# Patient Record
Sex: Male | Born: 1955 | Race: White | Hispanic: No | Marital: Married | Smoking: Former smoker
Health system: Southern US, Community
[De-identification: ages and names within clinical notes are randomized; demographics above are authoritative.]

## PROBLEM LIST (undated history)

## (undated) DIAGNOSIS — I2510 Atherosclerotic heart disease of native coronary artery without angina pectoris: Secondary | ICD-10-CM

## (undated) DIAGNOSIS — E7800 Pure hypercholesterolemia, unspecified: Secondary | ICD-10-CM

## (undated) DIAGNOSIS — G4730 Sleep apnea, unspecified: Secondary | ICD-10-CM

## (undated) DIAGNOSIS — G629 Polyneuropathy, unspecified: Secondary | ICD-10-CM

## (undated) DIAGNOSIS — I219 Acute myocardial infarction, unspecified: Secondary | ICD-10-CM

## (undated) DIAGNOSIS — I10 Essential (primary) hypertension: Secondary | ICD-10-CM

## (undated) DIAGNOSIS — M1990 Unspecified osteoarthritis, unspecified site: Secondary | ICD-10-CM

## (undated) DIAGNOSIS — R7303 Prediabetes: Secondary | ICD-10-CM

## (undated) HISTORY — PX: COLONOSCOPY: SHX174

## (undated) HISTORY — PX: TONSILLECTOMY: SUR1361

## (undated) HISTORY — PX: TOE AMPUTATION: SHX809

## (undated) HISTORY — PX: SHOULDER ARTHROSCOPY WITH OPEN ROTATOR CUFF REPAIR: SHX6092

## (undated) HISTORY — PX: VASECTOMY: SHX75

## (undated) HISTORY — PX: NASAL SINUS SURGERY: SHX719

## (undated) HISTORY — PX: SHOULDER ARTHROSCOPY W/ ROTATOR CUFF REPAIR: SHX2400

---

## 2000-11-05 HISTORY — PX: CARDIAC CATHETERIZATION: SHX172

## 2019-11-06 HISTORY — PX: SHOULDER ARTHROSCOPY W/ ROTATOR CUFF REPAIR: SHX2400

## 2020-08-01 ENCOUNTER — Other Ambulatory Visit: Payer: Self-pay | Admitting: Neurological Surgery

## 2020-08-29 ENCOUNTER — Ambulatory Visit: Admit: 2020-08-29 | Payer: Self-pay | Admitting: Neurological Surgery

## 2020-08-29 SURGERY — POSTERIOR LUMBAR FUSION 2 LEVEL
Anesthesia: General | Site: Back

## 2021-01-16 ENCOUNTER — Other Ambulatory Visit: Payer: Self-pay | Admitting: Neurological Surgery

## 2021-01-16 DIAGNOSIS — M4310 Spondylolisthesis, site unspecified: Secondary | ICD-10-CM

## 2021-02-03 ENCOUNTER — Other Ambulatory Visit: Payer: Self-pay

## 2021-02-03 ENCOUNTER — Encounter (HOSPITAL_COMMUNITY): Payer: Self-pay | Admitting: Neurological Surgery

## 2021-02-03 ENCOUNTER — Other Ambulatory Visit (HOSPITAL_COMMUNITY)
Admission: RE | Admit: 2021-02-03 | Discharge: 2021-02-03 | Disposition: A | Discharge status: Home / Self Care | Payer: BC Managed Care – PPO | Source: Ambulatory Visit | Attending: Neurological Surgery | Admitting: Neurological Surgery

## 2021-02-03 DIAGNOSIS — Z20822 Contact with and (suspected) exposure to covid-19: Secondary | ICD-10-CM | POA: Diagnosis not present

## 2021-02-03 DIAGNOSIS — Z01812 Encounter for preprocedural laboratory examination: Secondary | ICD-10-CM | POA: Insufficient documentation

## 2021-02-03 NOTE — Anesthesia Preprocedure Evaluation (Addendum)
Anesthesia Evaluation  Patient identified by MRN, date of birth, ID band Patient awake    Reviewed: Allergy & Precautions, NPO status , Patient's Chart, lab work & pertinent test results  History of Anesthesia Complications Negative for: history of anesthetic complications  Airway Mallampati: III  TM Distance: >3 FB Neck ROM: Full    Dental  (+) Missing   Pulmonary sleep apnea and Continuous Positive Airway Pressure Ventilation , former smoker,    Pulmonary exam normal        Cardiovascular hypertension, + CAD, + Past MI and + Cardiac Stents (2002)  Normal cardiovascular exam     Neuro/Psych negative neurological ROS     GI/Hepatic negative GI ROS, Neg liver ROS,   Endo/Other  diabetes (pre-DM)  Renal/GU negative Renal ROS  negative genitourinary   Musculoskeletal  (+) Arthritis ,   Abdominal   Peds  Hematology negative hematology ROS (+)   Anesthesia Other Findings  Nuclear stress test 12/02/17 (care everywhere):  1. Normal myocardial perfusion scan.  2. Normal left ventricular function.  3. Left ventricular ejection fraction is 70%.  4. Stress to rest volume ratio (TID) is 0.93 for the left ventricle (normal).  5. Normal Lexiscan stress EKG.  6. When compared to results from 05/11/2011, this study is normal with no high risk findings   Cardiac cath 05/28/11 (care everywhere):  1. Widely patent LAD and RCA stents.  2. Preserved LVSF.  3. Normal LVEDP  Reproductive/Obstetrics                          Anesthesia Physical Anesthesia Plan  ASA: III  Anesthesia Plan: General   Post-op Pain Management:    Induction: Intravenous  PONV Risk Score and Plan: 2 and Ondansetron, Dexamethasone, Treatment may vary due to age or medical condition and Midazolam  Airway Management Planned: Oral ETT  Additional Equipment: None  Intra-op Plan:   Post-operative Plan: Extubation in  OR  Informed Consent: I have reviewed the patients History and Physical, chart, labs and discussed the procedure including the risks, benefits and alternatives for the proposed anesthesia with the patient or authorized representative who has indicated his/her understanding and acceptance.     Dental advisory given  Plan Discussed with:   Anesthesia Plan Comments: (See APP note by Joslyn Hy, FNP )      Anesthesia Quick Evaluation

## 2021-02-03 NOTE — Progress Notes (Signed)
PCP -  Cardiologist - Dr. Marden Noble  PPM/ICD - n/a   Chest x-ray - DOS EKG - DOS Stress Test - 2019 ECHO -  Cardiac Cath - 2012  Sleep Study - yes CPAP - yes  Pre-diabetic - does not know blood sugar   Aspirin Instructions: pt states that he stopped his Aspirin on 01/30/21    COVID TEST- scheduled for 4/1   Anesthesia review: yes  Patient denies shortness of breath, fever, cough and chest pain at PAT appointment   All instructions explained to the patient, with a verbal understanding of the material.  Patient also instructed to self quarantine after being tested for COVID-19. The opportunity to ask questions was provided.

## 2021-02-03 NOTE — Progress Notes (Signed)
Anesthesia Chart Review:  Pt is a same day work up   Case: [id number] Date/Time: 02/06/21 0930   Procedure: PLIF - L3-L4 - L4-L5 (N/A Back)   Anesthesia type: General   Pre-op diagnosis: Spondylolisthesis   Location: [facility] ROOM [facility] / [facility]   Surgeons: Tia Alert, MD      DISCUSSION: Pt is 65 years old with hx CAD (LAD and RCA stents), HTN, pre-diabetes, OSA   PROVIDERS: - Receives primary care at [facility] - Cardiologist is M. Marden Noble, MD. Last office visit 08/16/20 (notes in care everywhere)    LABS: Will be obtained day of surgery   IMAGES:  CXR: Will be obtained day of surgery    EKG: Will be obtained day of surgery    CV: Nuclear stress test 12/02/17 (care everywhere):  1. Normal myocardial perfusion scan.  2. Normal left ventricular function.  3. Left ventricular ejection fraction is 70 %.  4. Stress to rest volume ratio (TID) is 0.93 for the left ventricle (normal).  5. Normal Lexiscan stress EKG.  6. When compared to results from 05/11/2011, this study is normal with no high risk findings   Cardiac cath 05/28/11 (care everywhere):  1. Widely patent LAD and RCA stents.  2. Preserved LVSF.  3. Normal LVEDP    Past Medical History:  Diagnosis Date  . Arthritis   . Coronary artery disease   . Hypercholesteremia   . Hypertension   . Myocardial infarction (HCC)   . Peripheral neuropathy   . Pre-diabetes   . Sleep apnea    wears CPAP    Past Surgical History:  Procedure Laterality Date  . CARDIAC CATHETERIZATION  2002   with 3 stents  . COLONOSCOPY    . NASAL SINUS SURGERY     x3  . SHOULDER ARTHROSCOPY W/ ROTATOR CUFF REPAIR Right 2021  . SHOULDER ARTHROSCOPY W/ ROTATOR CUFF REPAIR Left    x2  . SHOULDER ARTHROSCOPY WITH OPEN ROTATOR CUFF REPAIR Left   . TOE AMPUTATION Left    toe #3  . TOE AMPUTATION Right    toe #4  . TONSILLECTOMY    . VASECTOMY      MEDICATIONS: No current facility-administered medications for this  encounter.   Marland Kitchen ALPRAZolam (XANAX) 0.5 MG tablet  . aspirin EC 81 MG tablet  . atorvastatin (LIPITOR) 40 MG tablet  . Coenzyme Q10 (CO Q-10) 100 MG CAPS  . lisinopril (ZESTRIL) 5 MG tablet  . Magnesium Oxide (MAG-200) 200 MG TABS  . metoprolol tartrate (LOPRESSOR) 25 MG tablet  . Multiple Vitamin (MULTIVITAMIN WITH MINERALS) TABS tablet  . nortriptyline (PAMELOR) 50 MG capsule  . omeprazole (PRILOSEC) 20 MG capsule  . Potassium 99 MG TABS  . pregabalin (LYRICA) 150 MG capsule  . tamsulosin (FLOMAX) 0.4 MG CAPS capsule  . vitamin B-12 (CYANOCOBALAMIN) 500 MCG tablet    If labs, EKG, and CXR acceptable day of surgery, I anticipate pt can proceed with surgery as scheduled.  Rica Mast, PhD, FNP-BC [facility]/Anesthesiology Phone: [phone] 02/03/2021 11:58 AM

## 2021-02-04 LAB — SARS CORONAVIRUS 2 (TAT 6-24 HRS): SARS Coronavirus 2: NEGATIVE

## 2021-02-06 ENCOUNTER — Inpatient Hospital Stay (HOSPITAL_COMMUNITY): Payer: No Typology Code available for payment source | Admitting: Emergency Medicine

## 2021-02-06 ENCOUNTER — Other Ambulatory Visit: Payer: Self-pay

## 2021-02-06 ENCOUNTER — Inpatient Hospital Stay (HOSPITAL_COMMUNITY): Payer: No Typology Code available for payment source

## 2021-02-06 ENCOUNTER — Encounter (HOSPITAL_COMMUNITY): Payer: Self-pay | Admitting: Neurological Surgery

## 2021-02-06 ENCOUNTER — Inpatient Hospital Stay (HOSPITAL_COMMUNITY)
Admission: RE | Admit: 2021-02-06 | Discharge: 2021-02-07 | DRG: 455 | Disposition: A | Discharge status: Home / Self Care | Payer: No Typology Code available for payment source | Attending: Neurological Surgery | Admitting: Neurological Surgery

## 2021-02-06 ENCOUNTER — Encounter (HOSPITAL_COMMUNITY)
Admission: RE | Disposition: A | Discharge status: Home / Self Care | Payer: Self-pay | Source: Home / Self Care | Attending: Neurological Surgery

## 2021-02-06 DIAGNOSIS — G629 Polyneuropathy, unspecified: Secondary | ICD-10-CM | POA: Diagnosis present

## 2021-02-06 DIAGNOSIS — M4310 Spondylolisthesis, site unspecified: Secondary | ICD-10-CM

## 2021-02-06 DIAGNOSIS — I2510 Atherosclerotic heart disease of native coronary artery without angina pectoris: Secondary | ICD-10-CM | POA: Diagnosis present

## 2021-02-06 DIAGNOSIS — Z20822 Contact with and (suspected) exposure to covid-19: Secondary | ICD-10-CM | POA: Diagnosis not present

## 2021-02-06 DIAGNOSIS — Z89422 Acquired absence of other left toe(s): Secondary | ICD-10-CM

## 2021-02-06 DIAGNOSIS — Z888 Allergy status to other drugs, medicaments and biological substances status: Secondary | ICD-10-CM

## 2021-02-06 DIAGNOSIS — R7303 Prediabetes: Secondary | ICD-10-CM | POA: Diagnosis present

## 2021-02-06 DIAGNOSIS — Z885 Allergy status to narcotic agent status: Secondary | ICD-10-CM

## 2021-02-06 DIAGNOSIS — M48061 Spinal stenosis, lumbar region without neurogenic claudication: Secondary | ICD-10-CM | POA: Diagnosis not present

## 2021-02-06 DIAGNOSIS — M1990 Unspecified osteoarthritis, unspecified site: Secondary | ICD-10-CM | POA: Diagnosis present

## 2021-02-06 DIAGNOSIS — M5116 Intervertebral disc disorders with radiculopathy, lumbar region: Secondary | ICD-10-CM | POA: Diagnosis present

## 2021-02-06 DIAGNOSIS — I10 Essential (primary) hypertension: Secondary | ICD-10-CM | POA: Diagnosis not present

## 2021-02-06 DIAGNOSIS — Z79899 Other long term (current) drug therapy: Secondary | ICD-10-CM

## 2021-02-06 DIAGNOSIS — Z89421 Acquired absence of other right toe(s): Secondary | ICD-10-CM | POA: Diagnosis not present

## 2021-02-06 DIAGNOSIS — Z87891 Personal history of nicotine dependence: Secondary | ICD-10-CM

## 2021-02-06 DIAGNOSIS — Z886 Allergy status to analgesic agent status: Secondary | ICD-10-CM

## 2021-02-06 DIAGNOSIS — I252 Old myocardial infarction: Secondary | ICD-10-CM | POA: Diagnosis not present

## 2021-02-06 DIAGNOSIS — M4316 Spondylolisthesis, lumbar region: Secondary | ICD-10-CM | POA: Diagnosis present

## 2021-02-06 DIAGNOSIS — E7800 Pure hypercholesterolemia, unspecified: Secondary | ICD-10-CM | POA: Diagnosis present

## 2021-02-06 DIAGNOSIS — Z419 Encounter for procedure for purposes other than remedying health state, unspecified: Secondary | ICD-10-CM

## 2021-02-06 DIAGNOSIS — Z7982 Long term (current) use of aspirin: Secondary | ICD-10-CM | POA: Diagnosis not present

## 2021-02-06 DIAGNOSIS — Z981 Arthrodesis status: Secondary | ICD-10-CM

## 2021-02-06 DIAGNOSIS — G4730 Sleep apnea, unspecified: Secondary | ICD-10-CM | POA: Diagnosis not present

## 2021-02-06 HISTORY — DX: Unspecified osteoarthritis, unspecified site: M19.90

## 2021-02-06 HISTORY — DX: Polyneuropathy, unspecified: G62.9

## 2021-02-06 HISTORY — DX: Pure hypercholesterolemia, unspecified: E78.00

## 2021-02-06 HISTORY — DX: Prediabetes: R73.03

## 2021-02-06 HISTORY — DX: Atherosclerotic heart disease of native coronary artery without angina pectoris: I25.10

## 2021-02-06 HISTORY — DX: Acute myocardial infarction, unspecified: I21.9

## 2021-02-06 HISTORY — DX: Sleep apnea, unspecified: G47.30

## 2021-02-06 HISTORY — DX: Essential (primary) hypertension: I10

## 2021-02-06 LAB — CBC WITH DIFFERENTIAL/PLATELET
Abs Immature Granulocytes: 0.04 10*3/uL (ref 0.00–0.07)
Basophils Absolute: 0.1 10*3/uL (ref 0.0–0.1)
Basophils Relative: 1 %
Eosinophils Absolute: 0.2 10*3/uL (ref 0.0–0.5)
Eosinophils Relative: 4 %
HCT: 41.9 % (ref 39.0–52.0)
Hemoglobin: 14.1 g/dL (ref 13.0–17.0)
Immature Granulocytes: 1 %
Lymphocytes Relative: 32 %
Lymphs Abs: 1.9 10*3/uL (ref 0.7–4.0)
MCH: 30.6 pg (ref 26.0–34.0)
MCHC: 33.7 g/dL (ref 30.0–36.0)
MCV: 90.9 fL (ref 80.0–100.0)
Monocytes Absolute: 0.7 10*3/uL (ref 0.1–1.0)
Monocytes Relative: 12 %
Neutro Abs: 3.2 10*3/uL (ref 1.7–7.7)
Neutrophils Relative %: 50 %
Platelets: 239 10*3/uL (ref 150–400)
RBC: 4.61 MIL/uL (ref 4.22–5.81)
RDW: 12.3 % (ref 11.5–15.5)
WBC: 6.1 10*3/uL (ref 4.0–10.5)
nRBC: 0 % (ref 0.0–0.2)

## 2021-02-06 LAB — BASIC METABOLIC PANEL
Anion gap: 7 (ref 5–15)
BUN: 24 mg/dL — ABNORMAL HIGH (ref 8–23)
CO2: 26 mmol/L (ref 22–32)
Calcium: 9.3 mg/dL (ref 8.9–10.3)
Chloride: 105 mmol/L (ref 98–111)
Creatinine, Ser: 0.94 mg/dL (ref 0.61–1.24)
GFR, Estimated: >60 mL/min (ref >60)
Glucose, Bld: 122 mg/dL — ABNORMAL HIGH (ref 70–99)
Potassium: 3.9 mmol/L (ref 3.5–5.1)
Sodium: 138 mmol/L (ref 135–145)

## 2021-02-06 LAB — PROTIME-INR
INR: 1.1 (ref 0.8–1.2)
Prothrombin Time: 13.5 seconds (ref 11.4–15.2)

## 2021-02-06 LAB — ABO/RH: ABO/RH(D): O POS

## 2021-02-06 LAB — GLUCOSE, CAPILLARY: Glucose-Capillary: 174 mg/dL — ABNORMAL HIGH (ref 70–99)

## 2021-02-06 LAB — TYPE AND SCREEN
ABO/RH(D): O POS
Antibody Screen: NEGATIVE

## 2021-02-06 LAB — SURGICAL PCR SCREEN
MRSA, PCR: NEGATIVE
Staphylococcus aureus: POSITIVE — AB

## 2021-02-06 SURGERY — POSTERIOR LUMBAR FUSION 2 LEVEL
Anesthesia: General | Site: Back

## 2021-02-06 MED ORDER — SODIUM CHLORIDE 0.9% FLUSH: 3.0000 mL | INTRAVENOUS | Status: DC | PRN

## 2021-02-06 MED ORDER — CEFAZOLIN SODIUM-DEXTROSE 2-4 GM/100ML-% IV SOLN
2.0000 g | Freq: Three times a day (TID) | INTRAVENOUS | Status: AC
  Administered 2021-02-06 – 2021-02-07 (×2): 2 g via INTRAVENOUS
  Filled 2021-02-06 (×2): qty 100

## 2021-02-06 MED ORDER — FENTANYL CITRATE (PF) 100 MCG/2ML IJ SOLN
INTRAMUSCULAR | Status: AC
  Administered 2021-02-06: 50 ug via INTRAVENOUS
  Filled 2021-02-06: qty 2

## 2021-02-06 MED ORDER — METHOCARBAMOL 500 MG PO TABS
500.0000 mg | ORAL_TABLET | Freq: Four times a day (QID) | ORAL | Status: DC | PRN
  Administered 2021-02-06 – 2021-02-07 (×3): 500 mg via ORAL
  Filled 2021-02-06 (×3): qty 1

## 2021-02-06 MED ORDER — EPHEDRINE 5 MG/ML INJ
INTRAVENOUS | Status: AC
  Filled 2021-02-06: qty 10

## 2021-02-06 MED ORDER — AMISULPRIDE (ANTIEMETIC) 5 MG/2ML IV SOLN: 10.0000 mg | Freq: Once | INTRAVENOUS | Status: DC | PRN

## 2021-02-06 MED ORDER — PHENYLEPHRINE 40 MCG/ML (10ML) SYRINGE FOR IV PUSH (FOR BLOOD PRESSURE SUPPORT)
PREFILLED_SYRINGE | INTRAVENOUS | Status: DC | PRN
  Administered 2021-02-06: 120 ug via INTRAVENOUS
  Administered 2021-02-06: 80 ug via INTRAVENOUS

## 2021-02-06 MED ORDER — CEFAZOLIN SODIUM-DEXTROSE 2-4 GM/100ML-% IV SOLN
2.0000 g | INTRAVENOUS | Status: AC
  Administered 2021-02-06: 2 g via INTRAVENOUS
  Filled 2021-02-06: qty 100

## 2021-02-06 MED ORDER — DEXAMETHASONE SODIUM PHOSPHATE 10 MG/ML IJ SOLN
INTRAMUSCULAR | Status: AC
  Filled 2021-02-06: qty 1

## 2021-02-06 MED ORDER — PHENYLEPHRINE 40 MCG/ML (10ML) SYRINGE FOR IV PUSH (FOR BLOOD PRESSURE SUPPORT)
PREFILLED_SYRINGE | INTRAVENOUS | Status: AC
  Filled 2021-02-06: qty 20

## 2021-02-06 MED ORDER — ASPIRIN EC 81 MG PO TBEC
81.0000 mg | DELAYED_RELEASE_TABLET | Freq: Every day | ORAL | Status: DC
  Administered 2021-02-06: 81 mg via ORAL
  Filled 2021-02-06: qty 1

## 2021-02-06 MED ORDER — FENTANYL CITRATE (PF) 250 MCG/5ML IJ SOLN
INTRAMUSCULAR | Status: DC | PRN
  Administered 2021-02-06: 50 ug via INTRAVENOUS
  Administered 2021-02-06: 25 ug via INTRAVENOUS
  Administered 2021-02-06: 50 ug via INTRAVENOUS
  Administered 2021-02-06: 100 ug via INTRAVENOUS
  Administered 2021-02-06 (×2): 25 ug via INTRAVENOUS

## 2021-02-06 MED ORDER — THROMBIN 20000 UNITS EX SOLR
CUTANEOUS | Status: DC | PRN
  Administered 2021-02-06: 20 mL via TOPICAL

## 2021-02-06 MED ORDER — DIAZEPAM 5 MG/ML IJ SOLN
5.0000 mg | Freq: Once | INTRAMUSCULAR | Status: AC
  Administered 2021-02-06: 5 mg via INTRAVENOUS
  Filled 2021-02-06: qty 2

## 2021-02-06 MED ORDER — CHLORHEXIDINE GLUCONATE 0.12 % MT SOLN
OROMUCOSAL | Status: AC
  Administered 2021-02-06: 15 mL via OROMUCOSAL
  Filled 2021-02-06: qty 15

## 2021-02-06 MED ORDER — DEXAMETHASONE 4 MG PO TABS: 4.0000 mg | ORAL_TABLET | Freq: Four times a day (QID) | ORAL | Status: DC

## 2021-02-06 MED ORDER — BUPIVACAINE HCL (PF) 0.25 % IJ SOLN
INTRAMUSCULAR | Status: AC
  Filled 2021-02-06: qty 30

## 2021-02-06 MED ORDER — LACTATED RINGERS IV SOLN: INTRAVENOUS | Status: DC

## 2021-02-06 MED ORDER — ROCURONIUM BROMIDE 10 MG/ML (PF) SYRINGE
PREFILLED_SYRINGE | INTRAVENOUS | Status: AC
  Filled 2021-02-06: qty 30

## 2021-02-06 MED ORDER — SUGAMMADEX SODIUM 200 MG/2ML IV SOLN
INTRAVENOUS | Status: DC | PRN
  Administered 2021-02-06: 300 mg via INTRAVENOUS

## 2021-02-06 MED ORDER — FENTANYL CITRATE (PF) 100 MCG/2ML IJ SOLN: 25.0000 ug | INTRAMUSCULAR | Status: DC | PRN

## 2021-02-06 MED ORDER — LIDOCAINE 2% (20 MG/ML) 5 ML SYRINGE
INTRAMUSCULAR | Status: AC
  Filled 2021-02-06: qty 10

## 2021-02-06 MED ORDER — ONDANSETRON HCL 4 MG/2ML IJ SOLN
INTRAMUSCULAR | Status: AC
  Filled 2021-02-06: qty 4

## 2021-02-06 MED ORDER — MENTHOL 3 MG MT LOZG: 1.0000 | LOZENGE | OROMUCOSAL | Status: DC | PRN

## 2021-02-06 MED ORDER — ONDANSETRON HCL 4 MG/2ML IJ SOLN: 4.0000 mg | Freq: Once | INTRAMUSCULAR | Status: DC | PRN

## 2021-02-06 MED ORDER — POTASSIUM CHLORIDE IN NACL 20-0.9 MEQ/L-% IV SOLN: INTRAVENOUS | Status: DC

## 2021-02-06 MED ORDER — ACETAMINOPHEN 500 MG PO TABS
ORAL_TABLET | ORAL | Status: AC
  Administered 2021-02-06: 500 mg via ORAL
  Filled 2021-02-06: qty 1

## 2021-02-06 MED ORDER — PREGABALIN 75 MG PO CAPS
150.0000 mg | ORAL_CAPSULE | Freq: Two times a day (BID) | ORAL | Status: DC
  Administered 2021-02-06 – 2021-02-07 (×2): 150 mg via ORAL
  Filled 2021-02-06 (×2): qty 2

## 2021-02-06 MED ORDER — THROMBIN 5000 UNITS EX SOLR
OROMUCOSAL | Status: DC | PRN
  Administered 2021-02-06: 5 mL via TOPICAL

## 2021-02-06 MED ORDER — PHENOL 1.4 % MT LIQD: 1.0000 | OROMUCOSAL | Status: DC | PRN

## 2021-02-06 MED ORDER — ACETAMINOPHEN 650 MG RE SUPP: 650.0000 mg | RECTAL | Status: DC | PRN

## 2021-02-06 MED ORDER — METHOCARBAMOL 1000 MG/10ML IJ SOLN
500.0000 mg | Freq: Four times a day (QID) | INTRAVENOUS | Status: DC | PRN
  Filled 2021-02-06: qty 5

## 2021-02-06 MED ORDER — PHENYLEPHRINE HCL-NACL 10-0.9 MG/250ML-% IV SOLN
INTRAVENOUS | Status: DC | PRN
  Administered 2021-02-06: 20 ug/min via INTRAVENOUS

## 2021-02-06 MED ORDER — ONDANSETRON HCL 4 MG PO TABS: 4.0000 mg | ORAL_TABLET | Freq: Four times a day (QID) | ORAL | Status: DC | PRN

## 2021-02-06 MED ORDER — CHLORHEXIDINE GLUCONATE CLOTH 2 % EX PADS: 6.0000 | MEDICATED_PAD | Freq: Once | CUTANEOUS | Status: DC

## 2021-02-06 MED ORDER — LISINOPRIL 10 MG PO TABS
5.0000 mg | ORAL_TABLET | Freq: Every day | ORAL | Status: DC
  Administered 2021-02-06 – 2021-02-07 (×2): 5 mg via ORAL
  Filled 2021-02-06 (×2): qty 1

## 2021-02-06 MED ORDER — THROMBIN 20000 UNITS EX SOLR
CUTANEOUS | Status: AC
  Filled 2021-02-06: qty 20000

## 2021-02-06 MED ORDER — ONDANSETRON HCL 4 MG/2ML IJ SOLN: 4.0000 mg | Freq: Four times a day (QID) | INTRAMUSCULAR | Status: DC | PRN

## 2021-02-06 MED ORDER — POTASSIUM 99 MG PO TABS: 99.0000 mg | ORAL_TABLET | Freq: Every day | ORAL | Status: DC

## 2021-02-06 MED ORDER — OXYCODONE HCL 5 MG/5ML PO SOLN: 5.0000 mg | Freq: Once | ORAL | Status: DC | PRN

## 2021-02-06 MED ORDER — ACETAMINOPHEN 325 MG PO TABS
650.0000 mg | ORAL_TABLET | ORAL | Status: DC | PRN
  Administered 2021-02-06 – 2021-02-07 (×2): 650 mg via ORAL
  Filled 2021-02-06 (×2): qty 2

## 2021-02-06 MED ORDER — LIDOCAINE 2% (20 MG/ML) 5 ML SYRINGE
INTRAMUSCULAR | Status: DC | PRN
  Administered 2021-02-06: 100 mg via INTRAVENOUS

## 2021-02-06 MED ORDER — MAGNESIUM OXIDE 400 (241.3 MG) MG PO TABS
200.0000 mg | ORAL_TABLET | Freq: Every day | ORAL | Status: DC
  Administered 2021-02-06 – 2021-02-07 (×2): 200 mg via ORAL
  Filled 2021-02-06 (×2): qty 1

## 2021-02-06 MED ORDER — VITAMIN B-12 1000 MCG PO TABS
500.0000 ug | ORAL_TABLET | Freq: Every day | ORAL | Status: DC
  Administered 2021-02-07: 500 ug via ORAL
  Filled 2021-02-06: qty 1

## 2021-02-06 MED ORDER — CELECOXIB 200 MG PO CAPS
200.0000 mg | ORAL_CAPSULE | Freq: Two times a day (BID) | ORAL | Status: DC
  Administered 2021-02-06 – 2021-02-07 (×2): 200 mg via ORAL
  Filled 2021-02-06 (×2): qty 1

## 2021-02-06 MED ORDER — METOPROLOL TARTRATE 25 MG PO TABS
25.0000 mg | ORAL_TABLET | Freq: Two times a day (BID) | ORAL | Status: DC
  Administered 2021-02-06 – 2021-02-07 (×2): 25 mg via ORAL
  Filled 2021-02-06 (×2): qty 1

## 2021-02-06 MED ORDER — MIDAZOLAM HCL 2 MG/2ML IJ SOLN
INTRAMUSCULAR | Status: AC
  Filled 2021-02-06: qty 2

## 2021-02-06 MED ORDER — DEXAMETHASONE SODIUM PHOSPHATE 10 MG/ML IJ SOLN
INTRAMUSCULAR | Status: DC | PRN
  Administered 2021-02-06: 10 mg via INTRAVENOUS

## 2021-02-06 MED ORDER — MUPIROCIN 2 % EX OINT
1.0000 "application " | TOPICAL_OINTMENT | Freq: Two times a day (BID) | CUTANEOUS | Status: DC
  Administered 2021-02-06 – 2021-02-07 (×2): 1 via NASAL
  Filled 2021-02-06: qty 22

## 2021-02-06 MED ORDER — ONDANSETRON HCL 4 MG/2ML IJ SOLN
INTRAMUSCULAR | Status: DC | PRN
  Administered 2021-02-06: 4 mg via INTRAVENOUS

## 2021-02-06 MED ORDER — FENTANYL CITRATE (PF) 250 MCG/5ML IJ SOLN
INTRAMUSCULAR | Status: AC
  Filled 2021-02-06: qty 5

## 2021-02-06 MED ORDER — NORTRIPTYLINE HCL 25 MG PO CAPS
50.0000 mg | ORAL_CAPSULE | Freq: Every day | ORAL | Status: DC
  Administered 2021-02-06: 50 mg via ORAL
  Filled 2021-02-06: qty 2

## 2021-02-06 MED ORDER — OXYCODONE HCL 5 MG PO TABS: 5.0000 mg | ORAL_TABLET | Freq: Once | ORAL | Status: DC | PRN

## 2021-02-06 MED ORDER — PROPOFOL 10 MG/ML IV BOLUS
INTRAVENOUS | Status: DC | PRN
  Administered 2021-02-06: 120 mg via INTRAVENOUS

## 2021-02-06 MED ORDER — CHLORHEXIDINE GLUCONATE 0.12 % MT SOLN: 15.0000 mL | Freq: Once | OROMUCOSAL | Status: AC

## 2021-02-06 MED ORDER — MIDAZOLAM HCL 2 MG/2ML IJ SOLN
INTRAMUSCULAR | Status: DC | PRN
  Administered 2021-02-06: 2 mg via INTRAVENOUS

## 2021-02-06 MED ORDER — ADULT MULTIVITAMIN W/MINERALS CH
1.0000 | ORAL_TABLET | Freq: Every day | ORAL | Status: DC
  Administered 2021-02-07: 1 via ORAL
  Filled 2021-02-06: qty 1

## 2021-02-06 MED ORDER — THROMBIN 5000 UNITS EX SOLR
CUTANEOUS | Status: AC
  Filled 2021-02-06: qty 5000

## 2021-02-06 MED ORDER — ATORVASTATIN CALCIUM 40 MG PO TABS
40.0000 mg | ORAL_TABLET | Freq: Every day | ORAL | Status: DC
  Administered 2021-02-06: 40 mg via ORAL
  Filled 2021-02-06: qty 1

## 2021-02-06 MED ORDER — TAMSULOSIN HCL 0.4 MG PO CAPS
0.4000 mg | ORAL_CAPSULE | Freq: Every day | ORAL | Status: DC
  Administered 2021-02-06 – 2021-02-07 (×2): 0.4 mg via ORAL
  Filled 2021-02-06 (×2): qty 1

## 2021-02-06 MED ORDER — PHENYLEPHRINE HCL (PRESSORS) 10 MG/ML IV SOLN
INTRAVENOUS | Status: AC
  Filled 2021-02-06: qty 1

## 2021-02-06 MED ORDER — SENNA 8.6 MG PO TABS
1.0000 | ORAL_TABLET | Freq: Two times a day (BID) | ORAL | Status: DC
  Administered 2021-02-06 – 2021-02-07 (×2): 8.6 mg via ORAL
  Filled 2021-02-06 (×2): qty 1

## 2021-02-06 MED ORDER — SODIUM CHLORIDE 0.9 % IV SOLN: 250.0000 mL | INTRAVENOUS | Status: DC

## 2021-02-06 MED ORDER — ORAL CARE MOUTH RINSE: 15.0000 mL | Freq: Once | OROMUCOSAL | Status: AC

## 2021-02-06 MED ORDER — DEXAMETHASONE SODIUM PHOSPHATE 10 MG/ML IJ SOLN
10.0000 mg | Freq: Once | INTRAMUSCULAR | Status: DC
  Filled 2021-02-06: qty 1

## 2021-02-06 MED ORDER — PROPOFOL 10 MG/ML IV BOLUS
INTRAVENOUS | Status: AC
  Filled 2021-02-06: qty 20

## 2021-02-06 MED ORDER — ACETAMINOPHEN 500 MG PO TABS: 500.0000 mg | ORAL_TABLET | ORAL | Status: AC

## 2021-02-06 MED ORDER — HYDROMORPHONE HCL 1 MG/ML IJ SOLN: 1.0000 mg | INTRAMUSCULAR | Status: DC | PRN

## 2021-02-06 MED ORDER — PANTOPRAZOLE SODIUM 40 MG PO TBEC
40.0000 mg | DELAYED_RELEASE_TABLET | Freq: Every day | ORAL | Status: DC
  Administered 2021-02-06 – 2021-02-07 (×2): 40 mg via ORAL
  Filled 2021-02-06 (×2): qty 1

## 2021-02-06 MED ORDER — 0.9 % SODIUM CHLORIDE (POUR BTL) OPTIME
TOPICAL | Status: DC | PRN
  Administered 2021-02-06: 1000 mL

## 2021-02-06 MED ORDER — SODIUM CHLORIDE 0.9% FLUSH
3.0000 mL | Freq: Two times a day (BID) | INTRAVENOUS | Status: DC
  Administered 2021-02-06: 3 mL via INTRAVENOUS

## 2021-02-06 MED ORDER — DEXAMETHASONE SODIUM PHOSPHATE 4 MG/ML IJ SOLN
4.0000 mg | Freq: Four times a day (QID) | INTRAMUSCULAR | Status: DC
  Administered 2021-02-06 – 2021-02-07 (×3): 4 mg via INTRAVENOUS
  Filled 2021-02-06 (×3): qty 1

## 2021-02-06 MED ORDER — HYDROMORPHONE HCL 2 MG PO TABS
2.0000 mg | ORAL_TABLET | ORAL | Status: DC | PRN
  Administered 2021-02-06 – 2021-02-07 (×3): 2 mg via ORAL
  Filled 2021-02-06 (×4): qty 1

## 2021-02-06 MED ORDER — ROCURONIUM BROMIDE 10 MG/ML (PF) SYRINGE
PREFILLED_SYRINGE | INTRAVENOUS | Status: DC | PRN
  Administered 2021-02-06: 20 mg via INTRAVENOUS
  Administered 2021-02-06: 100 mg via INTRAVENOUS
  Administered 2021-02-06 (×2): 20 mg via INTRAVENOUS

## 2021-02-06 MED ORDER — MORPHINE SULFATE (PF) 2 MG/ML IV SOLN
2.0000 mg | INTRAVENOUS | Status: DC | PRN
  Administered 2021-02-06: 2 mg via INTRAVENOUS
  Filled 2021-02-06: qty 1

## 2021-02-06 MED ORDER — GABAPENTIN 300 MG PO CAPS: 300.0000 mg | ORAL_CAPSULE | ORAL | Status: DC

## 2021-02-06 MED ORDER — BUPIVACAINE HCL (PF) 0.25 % IJ SOLN
INTRAMUSCULAR | Status: DC | PRN
  Administered 2021-02-06: 2.5 mL

## 2021-02-06 SURGICAL SUPPLY — 63 items
BASKET BONE COLLECTION (BASKET) ×2 IMPLANT
BENZOIN TINCTURE PRP APPL 2/3 (GAUZE/BANDAGES/DRESSINGS) ×2 IMPLANT
BLADE CLIPPER SURG (BLADE) IMPLANT
BONE FIBERS PLIAFX 10 (Bone Implant) ×2 IMPLANT
BUR CARBIDE MATCH 3.0 (BURR) ×2 IMPLANT
CANISTER SUCT 3000ML PPV (MISCELLANEOUS) ×2 IMPLANT
CNTNR URN SCR LID CUP LEK RST (MISCELLANEOUS) ×1 IMPLANT
CONT SPEC 4OZ STRL OR WHT (MISCELLANEOUS) ×1
COVER BACK TABLE 60X90IN (DRAPES) ×2 IMPLANT
COVER WAND RF STERILE (DRAPES) ×2 IMPLANT
DERMABOND ADVANCED (GAUZE/BANDAGES/DRESSINGS) ×1
DERMABOND ADVANCED .7 DNX12 (GAUZE/BANDAGES/DRESSINGS) ×1 IMPLANT
DIFFUSER DRILL AIR PNEUMATIC (MISCELLANEOUS) ×2 IMPLANT
DRAPE C-ARM 42X72 X-RAY (DRAPES) ×4 IMPLANT
DRAPE C-ARMOR (DRAPES) IMPLANT
DRAPE LAPAROTOMY 100X72X124 (DRAPES) ×2 IMPLANT
DRAPE SURG 17X23 STRL (DRAPES) ×2 IMPLANT
DRSG OPSITE POSTOP 4X6 (GAUZE/BANDAGES/DRESSINGS) ×2 IMPLANT
DURAPREP 26ML APPLICATOR (WOUND CARE) ×2 IMPLANT
ELECT REM PT RETURN 9FT ADLT (ELECTROSURGICAL) ×2
ELECTRODE REM PT RTRN 9FT ADLT (ELECTROSURGICAL) ×1 IMPLANT
EVACUATOR 1/8 PVC DRAIN (DRAIN) ×2 IMPLANT
GAUZE 4X4 16PLY RFD (DISPOSABLE) IMPLANT
GLOVE BIO SURGEON STRL SZ7 (GLOVE) IMPLANT
GLOVE BIO SURGEON STRL SZ8 (GLOVE) ×4 IMPLANT
GLOVE SURG POLYISO LF SZ7 (GLOVE) ×6 IMPLANT
GLOVE SURG POLYISO LF SZ7.5 (GLOVE) ×2 IMPLANT
GLOVE SURG UNDER POLY LF SZ7 (GLOVE) IMPLANT
GLOVE SURG UNDER POLY LF SZ7.5 (GLOVE) ×2 IMPLANT
GOWN STRL REUS W/ TWL LRG LVL3 (GOWN DISPOSABLE) IMPLANT
GOWN STRL REUS W/ TWL XL LVL3 (GOWN DISPOSABLE) ×3 IMPLANT
GOWN STRL REUS W/TWL 2XL LVL3 (GOWN DISPOSABLE) IMPLANT
GOWN STRL REUS W/TWL LRG LVL3 (GOWN DISPOSABLE)
GOWN STRL REUS W/TWL XL LVL3 (GOWN DISPOSABLE) ×3
GRAFT BONE PROTEIOS LRG 5CC (Orthopedic Implant) ×2 IMPLANT
HEMOSTAT POWDER KIT SURGIFOAM (HEMOSTASIS) IMPLANT
KIT BASIN OR (CUSTOM PROCEDURE TRAY) ×2 IMPLANT
KIT TURNOVER KIT B (KITS) ×2 IMPLANT
MILL MEDIUM DISP (BLADE) IMPLANT
NEEDLE HYPO 25X1 1.5 SAFETY (NEEDLE) ×2 IMPLANT
NS IRRIG 1000ML POUR BTL (IV SOLUTION) ×2 IMPLANT
PACK LAMINECTOMY NEURO (CUSTOM PROCEDURE TRAY) ×2 IMPLANT
PAD ARMBOARD 7.5X6 YLW CONV (MISCELLANEOUS) ×6 IMPLANT
PATTIES SURGICAL 1X1 (DISPOSABLE) ×2 IMPLANT
ROD LORD LIPPED TI 5.5X60 (Rod) ×4 IMPLANT
SCREW CORT SHANK MOD 6.5X40 (Screw) ×12 IMPLANT
SCREW POLYAXIAL TULIP (Screw) ×12 IMPLANT
SET SCREW (Screw) ×6 IMPLANT
SET SCREW SPNE (Screw) ×6 IMPLANT
SPACER BATT PS 9X25X11 (Spacer) ×4 IMPLANT
SPACER PEEK PS 25X9MM 9MM 5DEG (Spacer) ×4 IMPLANT
SPONGE LAP 4X18 RFD (DISPOSABLE) IMPLANT
SPONGE SURGIFOAM ABS GEL 100 (HEMOSTASIS) ×2 IMPLANT
STRIP CLOSURE SKIN 1/2X4 (GAUZE/BANDAGES/DRESSINGS) ×2 IMPLANT
SUT VIC AB 0 CT1 18XCR BRD8 (SUTURE) ×1 IMPLANT
SUT VIC AB 0 CT1 8-18 (SUTURE) ×1
SUT VIC AB 2-0 CP2 18 (SUTURE) ×2 IMPLANT
SUT VIC AB 3-0 SH 8-18 (SUTURE) ×4 IMPLANT
SYR CONTROL 10ML LL (SYRINGE) ×2 IMPLANT
TOWEL GREEN STERILE (TOWEL DISPOSABLE) ×2 IMPLANT
TOWEL GREEN STERILE FF (TOWEL DISPOSABLE) ×2 IMPLANT
TRAY FOLEY MTR SLVR 16FR STAT (SET/KITS/TRAYS/PACK) ×2 IMPLANT
WATER STERILE IRR 1000ML POUR (IV SOLUTION) ×2 IMPLANT

## 2021-02-06 NOTE — Progress Notes (Signed)
PHARMACIST - PHYSICIAN ORDER COMMUNICATION  CONCERNING: P&T Medication Policy on Herbal Medications  DESCRIPTION:  This patient's order for:  Potassium 99 mg (2.53 mEq) has been noted.  This product(s) is classified as an "herbal" or natural product. Due to a lack of definitive safety studies or FDA approval, nonstandard manufacturing practices, plus the potential risk of unknown drug-drug interactions while on inpatient medications, the Pharmacy and Therapeutics Committee does not permit the use of "herbal" or natural products of this type within [facility].   ACTION TAKEN: The pharmacy department is unable to verify this order at this time and your patient has been informed of this safety policy. Please reevaluate patient's clinical condition at discharge and address if the herbal or natural product(s) should be resumed at that time.

## 2021-02-06 NOTE — Progress Notes (Signed)
RT set up and placed patient on CPAP with home mask. Patient tolerating well.

## 2021-02-06 NOTE — Op Note (Signed)
02/06/2021  2:47 PM  PATIENT:  Kyle Orozco  65 y.o. 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 Decompressive lumbar laminectomy hemifacetectomy and foraminotomies L3-4 and L4-5 bilaterally requiring more work than would be required for a simple exposure of the disk for PLIF in order to adequately decompress the neural elements and address the spinal stenosis 2. Posterior lumbar interbody fusion L3-4 L4-5 using peek interbody cages packed with morcellized allograft and autograft  3. Posterior fixation L3-5 inclusive using Alphatec cortical pedicle screws.  4. Intertransverse arthrodesis L3-5 using morcellized autograft and allograft.  SURGEON:  Marikay Alar, MD  ASSISTANTS: Verlin Dike, FNP  ANESTHESIA:  General  EBL: 200 ml  Total I/O In: 1500 [I.V.:1500] Out: 800 [Urine:600; Blood:200]  BLOOD ADMINISTERED:none  DRAINS: none   INDICATION FOR PROCEDURE: This patient presented with back and leg pain. Imaging revealed postlaminectomy spondylolisthesis L3-4 with severe stenosis with recurrent disc herniation L4-5 on the right. The patient tried a reasonable attempt at conservative medical measures without relief. I recommended decompression and instrumented fusion to address the stenosis as well as the segmental  instability.  Patient understood the risks, benefits, and alternatives and potential outcomes and wished to proceed.  PROCEDURE DETAILS:  The patient was brought to the operating room. After induction of generalized endotracheal anesthesia the patient was rolled into the prone position on chest rolls and all pressure points were padded. The patient's lumbar region was cleaned and then prepped with DuraPrep and draped in the usual sterile fashion. Anesthesia was injected and then a dorsal midline incision was made and  carried down to the lumbosacral fascia. The fascia was opened and the paraspinous musculature was taken down in a subperiosteal fashion to expose L3-4 and L4-5. A self-retaining retractor was placed. Intraoperative fluoroscopy confirmed my level, and I started with placement of the L3 cortical pedicle screws. The pedicle screw entry zones were identified utilizing surface landmarks and  AP and lateral fluoroscopy. I scored the cortex with the high-speed drill and then used the hand drill to drill an upward and outward direction into the pedicle. I then tapped line to line. I then placed a 6.5 x 40 mm cortical pedicle screw into the pedicles of L3 bilaterally.  I then dissected in a suprafascial plane to expose the iliac crest.    I then turned my attention to the decompression and complete lumbar laminectomies, hemi- facetectomies, and foraminotomies were performed at L3 4 and L4-5.  My nurse practitioner was directly involved in the decompression and exposure of the neural elements. the patient had significant spinal stenosis and this required more work than would be required for a simple exposure of the disc for posterior lumbar interbody fusion which would only require a limited laminotomy. Much more generous decompression and generous foraminotomy was undertaken in order to adequately decompress the neural elements and address the patient's leg pain. The yellow ligament was removed to expose the underlying dura and nerve roots, and generous foraminotomies were performed to adequately decompress the neural elements. Both the exiting and traversing nerve roots were decompressed on both sides until a coronary dilator passed easily along the nerve roots. Once the decompression was complete, I turned my attention to the posterior lower lumbar interbody fusion. The epidural venous vasculature was coagulated and cut sharply. Disc space was incised and the initial discectomy was performed with pituitary rongeurs. The  disc  space was distracted with sequential distractors to a height of 9 mm at L3-4 and 11 mm at L4-5. We then used a series of scrapers and shavers to prepare the endplates for fusion. The midline was prepared with Epstein curettes. Once the complete discectomy was finished, we packed an appropriate sized interbody cage with local autograft and morcellized allograft, gently retracted the nerve root, and tapped the cage into position at L3-4 and L4-5.  The midline between the cages was packed with morselized autograft and allograft. We then turned our attention to the placement of the lower pedicle screws. The pedicle screw entry zones were identified utilizing surface landmarks and fluoroscopy. I drilled into each pedicle utilizing the hand drill, and tapped each pedicle with the appropriate tap. We palpated with a ball probe to assure no break in the cortex. We then placed 6.5 x 40 mm cortical pedicle screws into the pedicles bilaterally at L4 and L5.  My nurse practitioner assisted in placement of the pedicle screws.  We then decorticated the transverse processes and laid a mixture of morcellized autograft and allograft out over these to perform intertransverse arthrodesis at L3-L5. We then placed lordotic rods into the multiaxial screw heads of the pedicle screws and locked these in position with the locking caps and anti-torque device. We then checked our construct with AP and lateral fluoroscopy. Irrigated with copious amounts of bacitracin-containing saline solution. Inspected the nerve roots once again to assure adequate decompression, lined to the dura with Gelfoam, placed powdered vancomycin into the wound, and then we closed the muscle and the fascia with 0 Vicryl. Closed the subcutaneous tissues with 2-0 Vicryl and subcuticular tissues with 3-0 Vicryl. The skin was closed with benzoin and Steri-Strips. Dressing was then applied, the patient was awakened from general anesthesia and transported to the  recovery room in stable condition. At the end of the procedure all sponge, needle and instrument counts were correct.   PLAN OF CARE: admit to inpatient  PATIENT DISPOSITION:  PACU - hemodynamically stable.   Delay start of Pharmacological VTE agent (>24hrs) due to surgical blood loss or risk of bleeding:  yes

## 2021-02-06 NOTE — Anesthesia Procedure Notes (Signed)
Procedure Name: Intubation Date/Time: 02/06/2021 10:03 AM Performed by: Thelma Comp, CRNA Pre-anesthesia Checklist: Patient identified, Emergency Drugs available, Suction available and Patient being monitored Patient Re-evaluated:Patient Re-evaluated prior to induction Oxygen Delivery Method: Circle System Utilized Preoxygenation: Pre-oxygenation with 100% oxygen Induction Type: IV induction Ventilation: Two handed mask ventilation required and Oral airway inserted - appropriate to patient size Laryngoscope Size: Mac and 4 Grade View: Grade II Tube type: Oral Tube size: 7.5 mm Number of attempts: 1 Airway Equipment and Method: Stylet and Oral airway Placement Confirmation: ETT inserted through vocal cords under direct vision,  positive ETCO2 and breath sounds checked- equal and bilateral Secured at: 24 cm Tube secured with: Tape Dental Injury: Teeth and Oropharynx as per pre-operative assessment

## 2021-02-06 NOTE — Anesthesia Postprocedure Evaluation (Signed)
Anesthesia Post Note  Patient: Kyle Orozco  Procedure(s) Performed: Posterior Lumbar Interbody Fusion - Lumbar Three-Lumbar Four - Lumbar Four-Lumbar Five (N/A Back)     Patient location during evaluation: PACU Anesthesia Type: General Level of consciousness: awake and alert Pain management: pain level controlled Vital Signs Assessment: post-procedure vital signs reviewed and stable Respiratory status: spontaneous breathing, nonlabored ventilation and respiratory function stable Cardiovascular status: blood pressure returned to baseline and stable Postop Assessment: no apparent nausea or vomiting Anesthetic complications: no   No complications documented.  Last Vitals:  Vitals:   02/06/21 1449 02/06/21 1504  BP: 125/81 121/83  Pulse: 93 92  Resp: 12 13  Temp: 36.7 C   SpO2: 99% 93%    Last Pain:  Vitals:   02/06/21 1504  TempSrc:   PainSc: Asleep                 [name]

## 2021-02-06 NOTE — H&P (Signed)
Subjective: Patient is a 65 y.o. male admitted for back and leg pain. Onset of symptoms was several months ago, gradually worsening since that time.  The pain is rated moderate, and is located at the across the lower back and radiates to R groin and leg. The pain is described as aching and occurs all day. The symptoms have been progressive. Symptoms are exacerbated by exercise. MRI or CT showed DDD, stenosis HNP L3-4 L4-5 with spondylolisthesis L3-4. Previous surgery at both levels   Past Medical History:  Diagnosis Date  . Arthritis   . Coronary artery disease   . Hypercholesteremia   . Hypertension   . Myocardial infarction (HCC)   . Peripheral neuropathy   . Pre-diabetes   . Sleep apnea    wears CPAP    Past Surgical History:  Procedure Laterality Date  . CARDIAC CATHETERIZATION  2002   with 3 stents  . COLONOSCOPY    . NASAL SINUS SURGERY     x3  . SHOULDER ARTHROSCOPY W/ ROTATOR CUFF REPAIR Right 2021  . SHOULDER ARTHROSCOPY W/ ROTATOR CUFF REPAIR Left    x2  . SHOULDER ARTHROSCOPY WITH OPEN ROTATOR CUFF REPAIR Left   . TOE AMPUTATION Left    toe #3  . TOE AMPUTATION Right    toe #4  . TONSILLECTOMY    . VASECTOMY      Prior to Admission medications   Medication Sig Start Date End Date Taking? Authorizing Provider  ALPRAZolam Prudy Feeler) 0.5 MG tablet Take 0.5 mg by mouth at bedtime.   Yes [provider]  aspirin EC 81 MG tablet Take 81 mg by mouth at bedtime. Swallow whole.   Yes [provider]  atorvastatin (LIPITOR) 40 MG tablet Take 40 mg by mouth at bedtime.   Yes [provider]  Coenzyme Q10 (CO Q-10) 100 MG CAPS Take 100 mg by mouth daily.   Yes [provider]  diclofenac (VOLTAREN) 25 MG EC tablet Take 25 mg by mouth 2 (two) times daily.   Yes [provider]  lisinopril (ZESTRIL) 5 MG tablet Take 5 mg by mouth in the morning and at bedtime.   Yes [provider]  Magnesium Oxide (MAG-200) 200 MG TABS Take 200  mg by mouth daily.   Yes [provider]  metoprolol tartrate (LOPRESSOR) 25 MG tablet Take 25 mg by mouth 2 (two) times daily.   Yes [provider]  Multiple Vitamin (MULTIVITAMIN WITH MINERALS) TABS tablet Take 1 tablet by mouth daily.   Yes [provider]  nortriptyline (PAMELOR) 50 MG capsule Take 50 mg by mouth at bedtime.   Yes [provider]  omeprazole (PRILOSEC) 20 MG capsule Take 20 mg by mouth at bedtime.   Yes [provider]  Potassium 99 MG TABS Take 99 mg by mouth at bedtime.   Yes [provider]  pregabalin (LYRICA) 150 MG capsule Take 150 mg by mouth 2 (two) times daily.   Yes [provider]  tamsulosin (FLOMAX) 0.4 MG CAPS capsule Take 0.4 mg by mouth in the morning and at bedtime.   Yes [provider]  vitamin B-12 (CYANOCOBALAMIN) 500 MCG tablet Take 500 mcg by mouth daily.   Yes [provider]   Allergies  Allergen Reactions  . Neurontin [Gabapentin]     Tightness in chest  . Percocet [Oxycodone-Acetaminophen] Itching    Social History   Tobacco Use  . Smoking status: Former Smoker    Quit date:  02/27/1999    Years since quitting: 21.9  . Smokeless tobacco: Never Used  Substance Use Topics  . Alcohol use: Not Currently    Comment: 1- 2 drinks a year    History reviewed. No pertinent family history.   Review of Systems  Positive ROS: neg  All other systems have been reviewed and were otherwise negative with the exception of those mentioned in the HPI and as above.  Objective: Vital signs in last 24 hours: Temp:  [98.3 F (36.8 C)] 98.3 F (36.8 C) (04/04 0710) Pulse Rate:  [89] 89 (04/04 0710) Resp:  [18] 18 (04/04 0710) BP: (158)/(88) 158/88 (04/04 0710) SpO2:  [99 %] 99 % (04/04 0710) Weight:  [111.1 kg] 111.1 kg (04/04 0710)  General Appearance: Alert, cooperative, no distress, appears stated age Head: Normocephalic, without obvious abnormality,  atraumatic Eyes: PERRL, conjunctiva/corneas clear, EOM's intact    Neck: Supple, symmetrical, trachea midline Back: Symmetric, no curvature, ROM normal, no CVA tenderness Lungs:  respirations unlabored Heart: Regular rate and rhythm Abdomen: Soft, non-tender Extremities: Extremities normal, atraumatic, no cyanosis or edema Pulses: 2+ and symmetric all extremities Skin: Skin color, texture, turgor normal, no rashes or lesions  NEUROLOGIC:   Mental status: Alert and oriented x4,  no aphasia, good attention span, fund of knowledge, and memory Motor Exam - grossly normal Sensory Exam - grossly normal Reflexes: 1+ Coordination - grossly normal Gait - grossly normal Balance - grossly normal Cranial Nerves: I: smell Not tested  II: visual acuity  OS: nl    OD: nl  II: visual fields Full to confrontation  II: pupils Equal, round, reactive to light  III,VII: ptosis None  III,IV,VI: extraocular muscles  Full ROM  V: mastication Normal  V: facial light touch sensation  Normal  V,VII: corneal reflex  Present  VII: facial muscle function - upper  Normal  VII: facial muscle function - lower Normal  VIII: hearing Not tested  IX: soft palate elevation  Normal  IX,X: gag reflex Present  XI: trapezius strength  5/5  XI: sternocleidomastoid strength 5/5  XI: neck flexion strength  5/5  XII: tongue strength  Normal    Data Review Lab Results  Component Value Date   WBC 6.1 02/06/2021   HGB 14.1 02/06/2021   HCT 41.9 02/06/2021   MCV 90.9 02/06/2021   PLT 239 02/06/2021   Lab Results  Component Value Date   NA 138 02/06/2021   K 3.9 02/06/2021   CL 105 02/06/2021   CO2 26 02/06/2021   BUN 24 (H) 02/06/2021   CREATININE 0.94 02/06/2021   GLUCOSE 122 (H) 02/06/2021   Lab Results  Component Value Date   INR 1.1 02/06/2021    Assessment/Plan:  Estimated body mass index is 33.23 kg/m as calculated from the following:   Height as of this encounter: 6' (1.829 m).   Weight as  of this encounter: 111.1 kg. Patient admitted for PLIF L3-4 L4-5. Patient has failed a reasonable attempt at conservative therapy.  I explained the condition and procedure to the patient and answered any questions.  Patient wishes to proceed with procedure as planned. Understands risks/ benefits and typical outcomes of procedure.   Tia Alert 02/06/2021 9:38 AM

## 2021-02-06 NOTE — Transfer of Care (Signed)
Immediate Anesthesia Transfer of Care Note  Patient: Kyle Orozco  Procedure(s) Performed: Posterior Lumbar Interbody Fusion - Lumbar Three-Lumbar Four - Lumbar Four-Lumbar Five (N/A Back)  Patient Location: PACU  Anesthesia Type:General  Level of Consciousness: drowsy and patient cooperative  Airway & Oxygen Therapy: Patient Spontanous Breathing and Patient connected to face mask oxygen  Post-op Assessment: Report given to RN and Post -op Vital signs reviewed and stable  Post vital signs: Reviewed and stable  Last Vitals:  Vitals Value Taken Time  BP 125/81 02/06/21 1449  Temp    Pulse 92 02/06/21 1451  Resp 12 02/06/21 1451  SpO2 95 % 02/06/21 1451  Vitals shown include unvalidated device data.  Last Pain:  Vitals:   02/06/21 0812  TempSrc:   PainSc: 7       Patients Stated Pain Goal: 4 (02/06/21 8185)  Complications: No complications documented.

## 2021-02-07 MED ORDER — HYDROMORPHONE HCL 2 MG PO TABS: 2.0000 mg | ORAL_TABLET | ORAL | 0 refills | Status: AC | PRN

## 2021-02-07 MED ORDER — CELECOXIB 200 MG PO CAPS: 200.0000 mg | ORAL_CAPSULE | Freq: Two times a day (BID) | ORAL | 0 refills | Status: AC

## 2021-02-07 MED ORDER — METHOCARBAMOL 500 MG PO TABS: 500.0000 mg | ORAL_TABLET | Freq: Four times a day (QID) | ORAL | 1 refills | Status: AC | PRN

## 2021-02-07 NOTE — Discharge Summary (Signed)
Physician Discharge Summary  Patient ID: Kyle Orozco MRN: [id number] DOB/AGE: 1956/06/05 65 y.o.  Admit date: 02/06/2021 Discharge date: 02/07/2021  Admission Diagnoses: spondylolisthesis, stenosis, recurrent hnp, radiculopathy    Discharge Diagnoses: same   Discharged Condition: good  Hospital Course: The patient was admitted on 02/06/2021 and taken to the operating room where the patient underwent PLIF L3-4 L4-5. The patient tolerated the procedure well and was taken to the recovery room and then to the floor in stable condition. The hospital course was routine. There were no complications. The wound remained clean dry and intact. Pt had appropriate back soreness. No complaints of leg pain or new N/T/W. The patient remained afebrile with stable vital signs, and tolerated a regular diet. The patient continued to increase activities, and pain was well controlled with oral pain medications.   Consults: None  Significant Diagnostic Studies:  Results for orders placed or performed during the hospital encounter of 02/06/21  Surgical pcr screen   Specimen: Vein; Nasal Swab  Result Value Ref Range   MRSA, PCR NEGATIVE NEGATIVE   Staphylococcus aureus POSITIVE (A) NEGATIVE  Basic metabolic panel  Result Value Ref Range   Sodium 138 135 - 145 mmol/L   Potassium 3.9 3.5 - 5.1 mmol/L   Chloride 105 98 - 111 mmol/L   CO2 26 22 - 32 mmol/L   Glucose, Bld 122 (H) 70 - 99 mg/dL   BUN 24 (H) 8 - 23 mg/dL   Creatinine, Ser 3.87 0.61 - 1.24 mg/dL   Calcium 9.3 8.9 - 56.4 mg/dL   GFR, Estimated >33 >29 mL/min   Anion gap 7 5 - 15  CBC WITH DIFFERENTIAL  Result Value Ref Range   WBC 6.1 4.0 - 10.5 K/uL   RBC 4.61 4.22 - 5.81 MIL/uL   Hemoglobin 14.1 13.0 - 17.0 g/dL   HCT 51.8 84.1 - 66.0 %   MCV 90.9 80.0 - 100.0 fL   MCH 30.6 26.0 - 34.0 pg   MCHC 33.7 30.0 - 36.0 g/dL   RDW 63.0 16.0 - 10.9 %   Platelets 239 150 - 400 K/uL   nRBC 0.0 0.0 - 0.2 %   Neutrophils Relative % 50 %   Neutro  Abs 3.2 1.7 - 7.7 K/uL   Lymphocytes Relative 32 %   Lymphs Abs 1.9 0.7 - 4.0 K/uL   Monocytes Relative 12 %   Monocytes Absolute 0.7 0.1 - 1.0 K/uL   Eosinophils Relative 4 %   Eosinophils Absolute 0.2 0.0 - 0.5 K/uL   Basophils Relative 1 %   Basophils Absolute 0.1 0.0 - 0.1 K/uL   Immature Granulocytes 1 %   Abs Immature Granulocytes 0.04 0.00 - 0.07 K/uL  Protime-INR  Result Value Ref Range   Prothrombin Time 13.5 11.4 - 15.2 seconds   INR 1.1 0.8 - 1.2  Glucose, capillary  Result Value Ref Range   Glucose-Capillary 174 (H) 70 - 99 mg/dL   Comment 1 Notify RN    Comment 2 Document in Chart   Type and screen [facility]  Result Value Ref Range   ABO/RH(D) O POS    Antibody Screen NEG    Sample Expiration      02/09/2021,2359 Performed at [facility], [address], [place], [place] [address]   ABO/Rh  Result Value Ref Range   ABO/RH(D)      O POS Performed at [facility], [address] [place], [place] [address]  Chest 2 View  Result Date: 02/06/2021 CLINICAL DATA:  65 year old male undergoing preoperative evaluation prior to lumbar spine surgery EXAM: CHEST - 2 VIEW COMPARISON:  None. FINDINGS: The lungs are clear and negative for focal airspace consolidation, pulmonary edema or suspicious pulmonary nodule. No pleural effusion or pneumothorax. Cardiac and mediastinal contours are within normal limits. No acute fracture or lytic or blastic osseous lesions. The visualized upper abdominal bowel gas pattern is unremarkable. IMPRESSION: Negative chest x-ray. Electronically Signed   By: Malachy Moan M.D.   On: 02/06/2021 07:37   DG Lumbar Spine 2-3 Views  Result Date: 02/06/2021 CLINICAL DATA:  L3-4 AND L4-5 PLIF EXAM: OPERATIVE LUMBAR SPINE 2 VIEW(S) COMPARISON:  MRI LUMBAR SPINE 06/10/2020 FINDINGS: Intraoperative images demonstrate pedicle screw and rod fixation at L3-4 and L4-5. Disc spaces are in place. Alignment is anatomic.  IMPRESSION: L3-4 and L4-5 fusion without radiographic evidence for complication. Electronically Signed   By: Marin Roberts M.D.   On: 02/06/2021 16:03   DG C-Arm 1-60 Min  Result Date: 02/06/2021 CLINICAL DATA:  L3-4 AND L4-5 PLIF EXAM: OPERATIVE LUMBAR SPINE 2 VIEW(S) COMPARISON:  MRI LUMBAR SPINE 06/10/2020 FINDINGS: Intraoperative images demonstrate pedicle screw and rod fixation at L3-4 and L4-5. Disc spaces are in place. Alignment is anatomic. IMPRESSION: L3-4 and L4-5 fusion without radiographic evidence for complication. Electronically Signed   By: Marin Roberts M.D.   On: 02/06/2021 16:03    Antibiotics:  Anti-infectives (From admission, onward)   Start     Dose/Rate Route Frequency Ordered Stop   02/06/21 1815  ceFAZolin (ANCEF) IVPB 2g/100 mL premix        2 g 200 mL/hr over 30 Minutes Intravenous Every 8 hours 02/06/21 1620 02/07/21 0155   02/06/21 0730  ceFAZolin (ANCEF) IVPB 2g/100 mL premix        2 g 200 mL/hr over 30 Minutes Intravenous On call to O.R. 02/06/21 0718 02/06/21 1015      Discharge Exam: Blood pressure 108/70, pulse 92, temperature 97.7 F (36.5 C), temperature source Oral, resp. rate 18, height 6' (1.829 m), weight 111.1 kg, SpO2 98 %. Neurologic: Grossly normal Dressing dry  Discharge Medications:   Allergies as of 02/07/2021      Reactions   Neurontin [gabapentin]    Tightness in chest   Percocet [oxycodone-acetaminophen] Itching      Medication List    STOP taking these medications   diclofenac 25 MG EC tablet Commonly known as: VOLTAREN     TAKE these medications   ALPRAZolam 0.5 MG tablet Commonly known as: XANAX Take 0.5 mg by mouth at bedtime.   aspirin EC 81 MG tablet Take 81 mg by mouth at bedtime. Swallow whole.   atorvastatin 40 MG tablet Commonly known as: LIPITOR Take 40 mg by mouth at bedtime.   celecoxib 200 MG capsule Commonly known as: CELEBREX Take 1 capsule (200 mg total) by mouth 2 (two) times daily for  5 days.   Co Q-10 100 MG Caps Take 100 mg by mouth daily.   HYDROmorphone 2 MG tablet Commonly known as: DILAUDID Take 1 tablet (2 mg total) by mouth every 4 (four) hours as needed for severe pain.   lisinopril 5 MG tablet Commonly known as: ZESTRIL Take 5 mg by mouth in the morning and at bedtime.   Mag-200 200 MG Tabs Generic drug: Magnesium Oxide Take 200 mg by mouth daily.   methocarbamol 500 MG tablet Commonly known as: ROBAXIN Take 1 tablet (500 mg  total) by mouth every 6 (six) hours as needed for muscle spasms.   metoprolol tartrate 25 MG tablet Commonly known as: LOPRESSOR Take 25 mg by mouth 2 (two) times daily.   multivitamin with minerals Tabs tablet Take 1 tablet by mouth daily.   nortriptyline 50 MG capsule Commonly known as: PAMELOR Take 50 mg by mouth at bedtime.   omeprazole 20 MG capsule Commonly known as: PRILOSEC Take 20 mg by mouth at bedtime.   Potassium 99 MG Tabs Take 99 mg by mouth at bedtime.   pregabalin 150 MG capsule Commonly known as: LYRICA Take 150 mg by mouth 2 (two) times daily.   tamsulosin 0.4 MG Caps capsule Commonly known as: FLOMAX Take 0.4 mg by mouth in the morning and at bedtime.   vitamin B-12 500 MCG tablet Commonly known as: CYANOCOBALAMIN Take 500 mcg by mouth daily.            Durable Medical Equipment  (From admission, onward)         Start     Ordered   02/06/21 1621  DME Walker rolling  Once       Question:  Patient needs a walker to treat with the following condition  Answer:  S/P lumbar fusion   02/06/21 1620   02/06/21 1621  DME 3 n 1  Once        02/06/21 1620          Disposition: home   Final Dx: PLIF L3-4 L4-5  Discharge Instructions    Call MD for:  difficulty breathing, headache or visual disturbances   Complete by: As directed    Call MD for:  persistant nausea and vomiting   Complete by: As directed    Call MD for:  redness, tenderness, or signs of infection (pain, swelling,  redness, odor or green/yellow discharge around incision site)   Complete by: As directed    Call MD for:  severe uncontrolled pain   Complete by: As directed    Call MD for:  temperature >100.4   Complete by: As directed    Diet - low sodium heart healthy   Complete by: As directed    Increase activity slowly   Complete by: As directed    Remove dressing in 24 hours   Complete by: As directed        Follow-up Information    Tia Alert, MD. Schedule an appointment as soon as possible for a visit in 2 week(s).   Specialty: Neurosurgery Contact information: [address] [place] [address] [phone]                Signed: Tia Alert 02/07/2021, 7:46 AM

## 2021-02-07 NOTE — Evaluation (Signed)
Occupational Therapy Evaluation Patient Details Name: Kyle Orozco MRN: [id number] DOB: 1956-04-22 Today's Date: 02/07/2021    History of Present Illness 65 yo male presenting s/p PLIF L3-5. PMH including arthritis, CAD, HTN, MI, shoulder arthroscopy (bilateral), toe amp, and peripheral neuropathy.    Clinical Impression   PTA, pt was living with his wife and was independent. Currently, pt performing ADLs and functional mobility at Mod I level with increased time and effort. Provided education and handout on back precautions, brace management, bed mobility, grooming, LB ADLs, toileting, and tub transfer with seat; pt demonstrated and verbalized understanding. Answered all pt questions. Recommend dc home once medically stable per physician. All acute OT needs met and will sign off. Thank you.    Follow Up Recommendations  No OT follow up    Equipment Recommendations  None recommended by OT    Recommendations for Other Services PT consult     Precautions / Restrictions Precautions Precautions: Back Precaution Booklet Issued: Yes (comment) Precaution Comments: Reviewed back precautions and compensatory techniques Required Braces or Orthoses: Spinal Brace Spinal Brace: Lumbar corset;Applied in sitting position Restrictions Weight Bearing Restrictions: No      Mobility Bed Mobility Overal bed mobility: Modified Independent             General bed mobility comments: Pt performing log roll technique    Transfers Overall transfer level: Modified independent               General transfer comment: Increased time as needed    Balance Overall balance assessment: No apparent balance deficits (not formally assessed)                                         ADL either performed or assessed with clinical judgement   ADL Overall ADL's : Modified independent                                       General ADL Comments: Providing education  and handout on back precautions, brace management, LB ADLs, grooming, toileting, and functional transfers.     Vision Baseline Vision/History: Wears glasses Wears Glasses: At all times Patient Visual Report: No change from baseline       Perception     Praxis      Pertinent Vitals/Pain Pain Assessment: 0-10 Pain Score: 3  Pain Location: Back Pain Descriptors / Indicators: Constant;Discomfort;Grimacing Pain Intervention(s): Monitored during session;Repositioned     Hand Dominance Right   Extremity/Trunk Assessment Upper Extremity Assessment Upper Extremity Assessment: Overall WFL for tasks assessed   Lower Extremity Assessment Lower Extremity Assessment: Defer to PT evaluation   Cervical / Trunk Assessment Cervical / Trunk Assessment: Other exceptions Cervical / Trunk Exceptions: s/p back sx   Communication Communication Communication: No difficulties   Cognition Arousal/Alertness: Awake/alert Behavior During Therapy: WFL for tasks assessed/performed Overall Cognitive Status: Within Functional Limits for tasks assessed                                     General Comments       Exercises     Shoulder Instructions      Home Living Family/patient expects to be discharged to:: Private residence Living Arrangements: Spouse/significant other Available Help  at Discharge: Family;Available PRN/intermittently Type of Home: House Home Access: Stairs to enter [employer] of Steps: 1 Entrance Stairs-Rails: None Home Layout: One level     Bathroom Shower/Tub: Tub/shower unit (tub with door)   Bathroom Toilet: Handicapped height     Home Equipment: Shower seat - built in          Prior Functioning/Environment Level of Independence: Independent                 OT Problem List: Decreased activity tolerance;Decreased range of motion;Decreased knowledge of precautions;Decreased knowledge of use of DME or AE;Pain      OT  Treatment/Interventions:      OT Goals(Current goals can be found in the care plan section) Acute Rehab OT Goals Patient Stated Goal: Go home soon OT Goal Formulation: All assessment and education complete, DC therapy  OT Frequency:     Barriers to D/C:            Co-evaluation              AM-PAC OT "6 Clicks" Daily Activity     Outcome Measure Help from another person eating meals?: None Help from another person taking care of personal grooming?: None Help from another person toileting, which includes using toliet, bedpan, or urinal?: None Help from another person bathing (including washing, rinsing, drying)?: None Help from another person to put on and taking off regular upper body clothing?: None Help from another person to put on and taking off regular lower body clothing?: None 6 Click Score: 24   End of Session Equipment Utilized During Treatment: Back brace Nurse Communication: Mobility status  Activity Tolerance: Patient tolerated treatment well Patient left: in chair;with call bell/phone within reach  OT Visit Diagnosis: Other abnormalities of gait and mobility (R26.89);Muscle weakness (generalized) (M62.81);Pain Pain - part of body:  (Back)                Time: 3736-6815 OT Time Calculation (min): 15 min Charges:  OT General Charges $OT Visit: 1 Visit OT Evaluation $OT Eval Low Complexity: 1 Low  Jaelon Gatley MSOT, OTR/L Acute Rehab Pager: [phone] Office: [place] 02/07/2021, 8:20 AM

## 2021-02-07 NOTE — Progress Notes (Signed)
Patient is discharged from room 3C09 at this time. Alert and in stable condition. IV site d/c'd and instructions read to patient with understanding verbalized and all questions answered. Left unit via wheelchair with all belongings at side. 

## 2021-02-07 NOTE — Discharge Instructions (Signed)

## 2021-02-07 NOTE — Evaluation (Signed)
Physical Therapy Evaluation and Discharge Patient Details Name: Kyle Orozco MRN: [id number] DOB: 1956/08/09 Today's Date: 02/07/2021   History of Present Illness  Pt is a 65 y/o male presenting s/p PLIF L3-5 on 02/06/21. PMH including arthritis, CAD, HTN, MI, shoulder arthroscopy (bilateral), toe amp, and peripheral neuropathy.    Clinical Impression  Patient evaluated by Physical Therapy with no further acute PT needs identified. All education has been completed and the patient has no further questions. Pt was able to demonstrate transfers and ambulation with gross modified independence and no AD. Pt was educated on precautions, brace application/wearing schedule, appropriate activity progression, and car transfer. See below for any follow-up Physical Therapy or equipment needs. PT is signing off. Thank you for this referral.     Follow Up Recommendations No PT follow up;Supervision - Intermittent    Equipment Recommendations  None recommended by PT    Recommendations for Other Services       Precautions / Restrictions Precautions Precautions: Back Precaution Booklet Issued: Yes (comment) Precaution Comments: Reviewed handout and pt was cued for precautions during functional mobility. Required Braces or Orthoses: Spinal Brace Spinal Brace: Lumbar corset;Applied in sitting position Restrictions Weight Bearing Restrictions: No      Mobility  Bed Mobility Overal bed mobility: Modified Independent             General bed mobility comments: Pt was received sitting up in the recliner chair.    Transfers Overall transfer level: Modified independent Equipment used: None             General transfer comment: Pt with good control to sit/stand from recliner.  Ambulation/Gait Ambulation/Gait assistance: Modified independent (Device/Increase time) Gait Distance (Feet): 400 Feet Assistive device: None Gait Pattern/deviations: Step-through pattern;Decreased stride  length;Trunk flexed Gait velocity: Decreased Gait velocity interpretation: 1.31 - 2.62 ft/sec, indicative of limited community ambulator General Gait Details: No assist required. Pt ambulating with decreased knee flexion and decreased hip ROM, however pt reports this is how he has been ambulating due to pain prior to surgery.  Stairs            Wheelchair Mobility    Modified Rankin (Stroke Patients Only)       Balance Overall balance assessment: No apparent balance deficits (not formally assessed)                                           Pertinent Vitals/Pain Pain Assessment: Faces Pain Score: 3  Faces Pain Scale: Hurts a little bit Pain Location: Back Pain Descriptors / Indicators: Operative site guarding Pain Intervention(s): Monitored during session;Patient requesting pain meds-RN notified    Home Living Family/patient expects to be discharged to:: Private residence Living Arrangements: Spouse/significant other Available Help at Discharge: Family;Available PRN/intermittently Type of Home: House Home Access: Stairs to enter Entrance Stairs-Rails: None Entrance Stairs-Number of Steps: 1 Home Layout: Two level;Able to live on main level with bedroom/bathroom Home Equipment: Shower seat - built in [employer] account planner)      Prior Function Level of Independence: Independent               Hand Dominance   Dominant Hand: Right    Extremity/Trunk Assessment   Upper Extremity Assessment Upper Extremity Assessment: Defer to OT evaluation    Lower Extremity Assessment Lower Extremity Assessment: Overall WFL for tasks assessed    Cervical / Trunk Assessment Cervical / Trunk  Assessment: Other exceptions Cervical / Trunk Exceptions: s/p back sx  Communication   Communication: No difficulties  Cognition Arousal/Alertness: Awake/alert Behavior During Therapy: WFL for tasks assessed/performed Overall Cognitive Status: Within Functional Limits for  tasks assessed                                        General Comments      Exercises     Assessment/Plan    PT Assessment Patent does not need any further PT services  PT Problem List         PT Treatment Interventions      PT Goals (Current goals can be found in the Care Plan section)  Acute Rehab PT Goals Patient Stated Goal: Get back to outpatient rehab for his recent shoulder surgery, as well as riding his motorcycle. PT Goal Formulation: All assessment and education complete, DC therapy    Frequency     Barriers to discharge        Co-evaluation               AM-PAC PT "6 Clicks" Mobility  Outcome Measure Help needed turning from your back to your side while in a flat bed without using bedrails?: None Help needed moving from lying on your back to sitting on the side of a flat bed without using bedrails?: None Help needed moving to and from a bed to a chair (including a wheelchair)?: None Help needed standing up from a chair using your arms (e.g., wheelchair or bedside chair)?: None Help needed to walk in hospital room?: None Help needed climbing 3-5 steps with a railing? : A Little 6 Click Score: 23    End of Session Equipment Utilized During Treatment: Back brace Activity Tolerance: Patient tolerated treatment well Patient left: in chair;with call bell/phone within reach Nurse Communication: Mobility status;Patient requests pain meds (in preparation for 2 hour car ride home) PT Visit Diagnosis: Unsteadiness on feet (R26.81);Pain Pain - part of body:  (back)    Time: 9379-0240 PT Time Calculation (min) (ACUTE ONLY): 15 min   Charges:   PT Evaluation $PT Eval Low Complexity: 1 Low          Conni Slipper, PT, DPT Acute Rehabilitation Services Pager: [phone] Office: [phone]   Marylynn Pearson 02/07/2021, 8:47 AM

## 2021-11-12 IMAGING — CR DG CHEST 2V
2 series · 2 of 2 positions shown · non-contrast
Comparison: None.

CLINICAL DATA: 64-year-old male undergoing preoperative evaluation
prior to lumbar spine surgery

EXAM:
CHEST - 2 VIEW

[w chest pa]
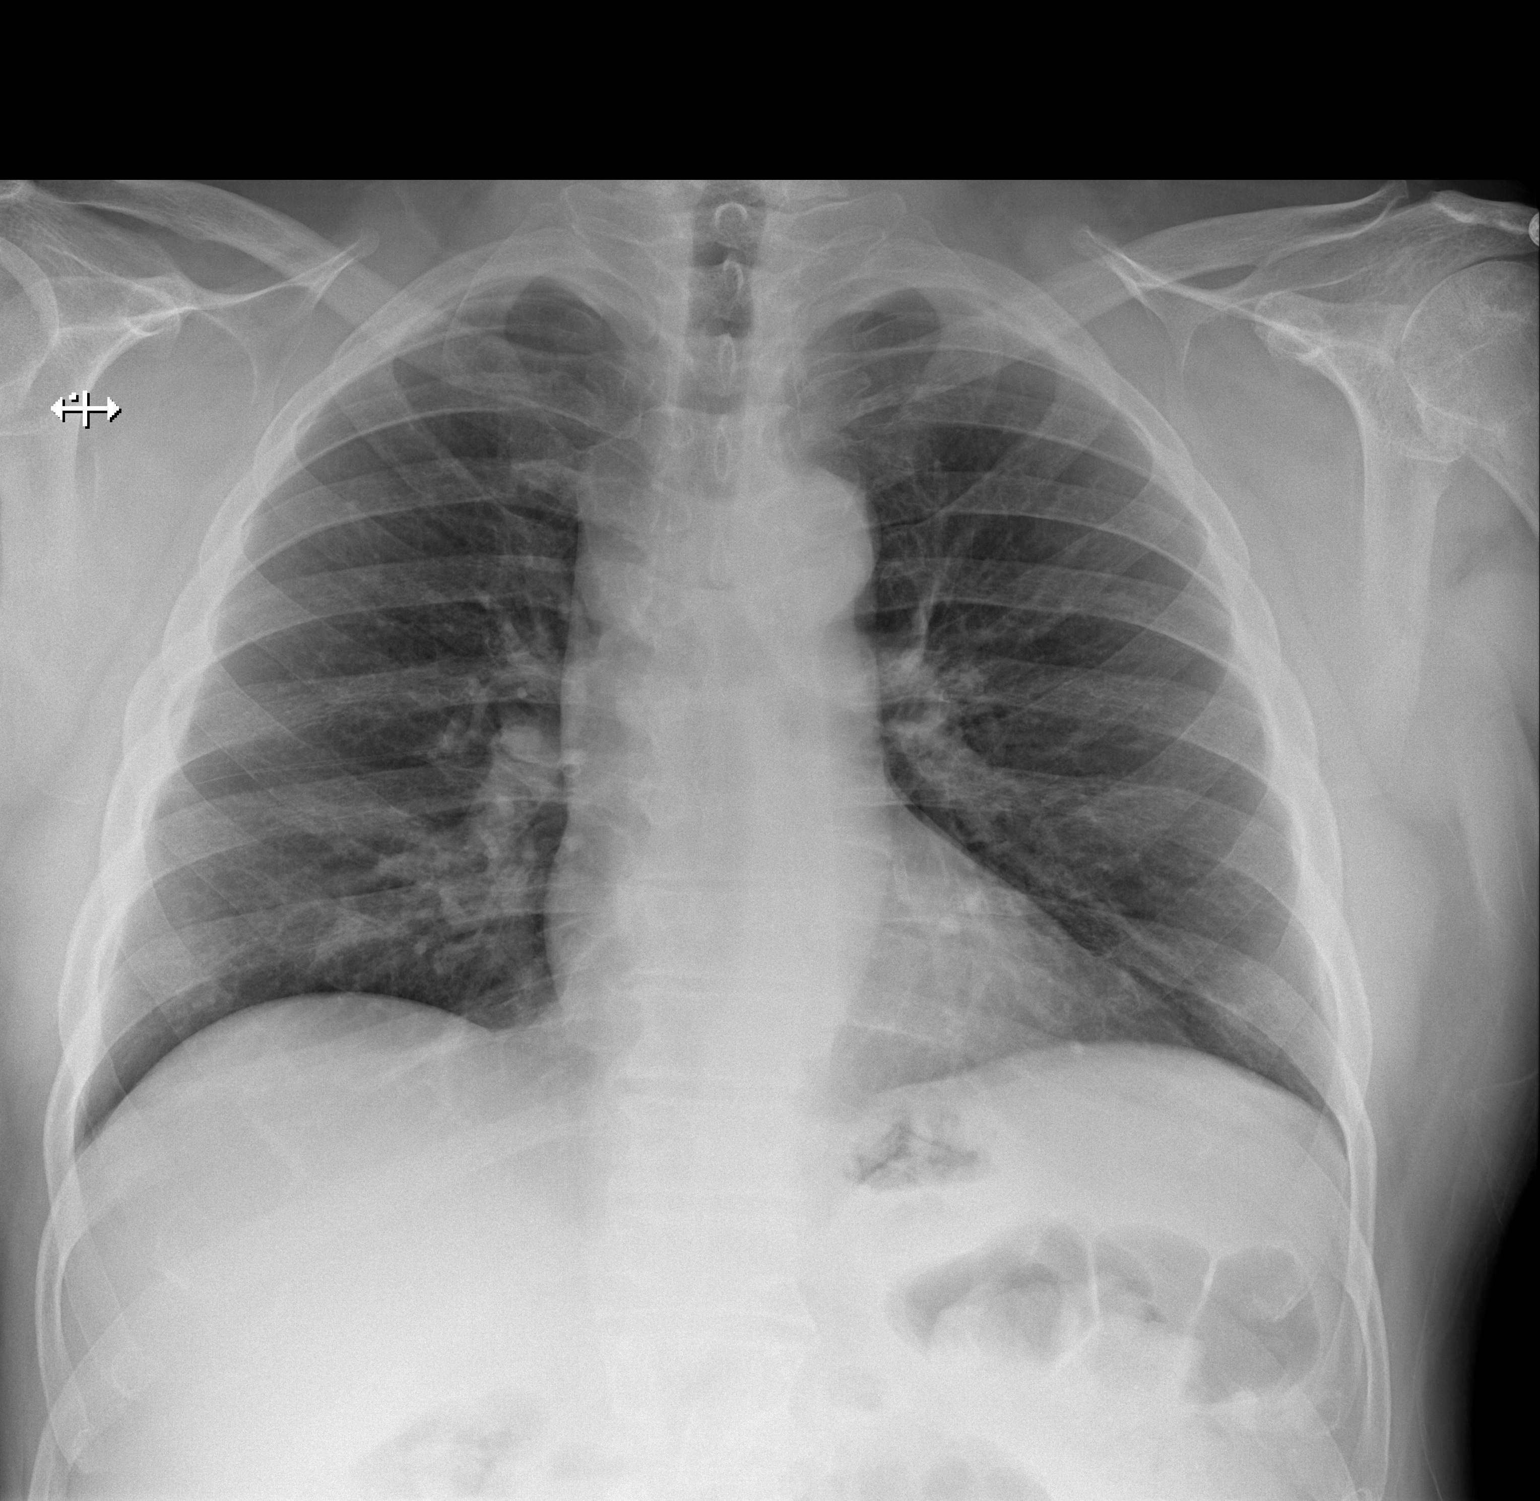

[w chest lat]
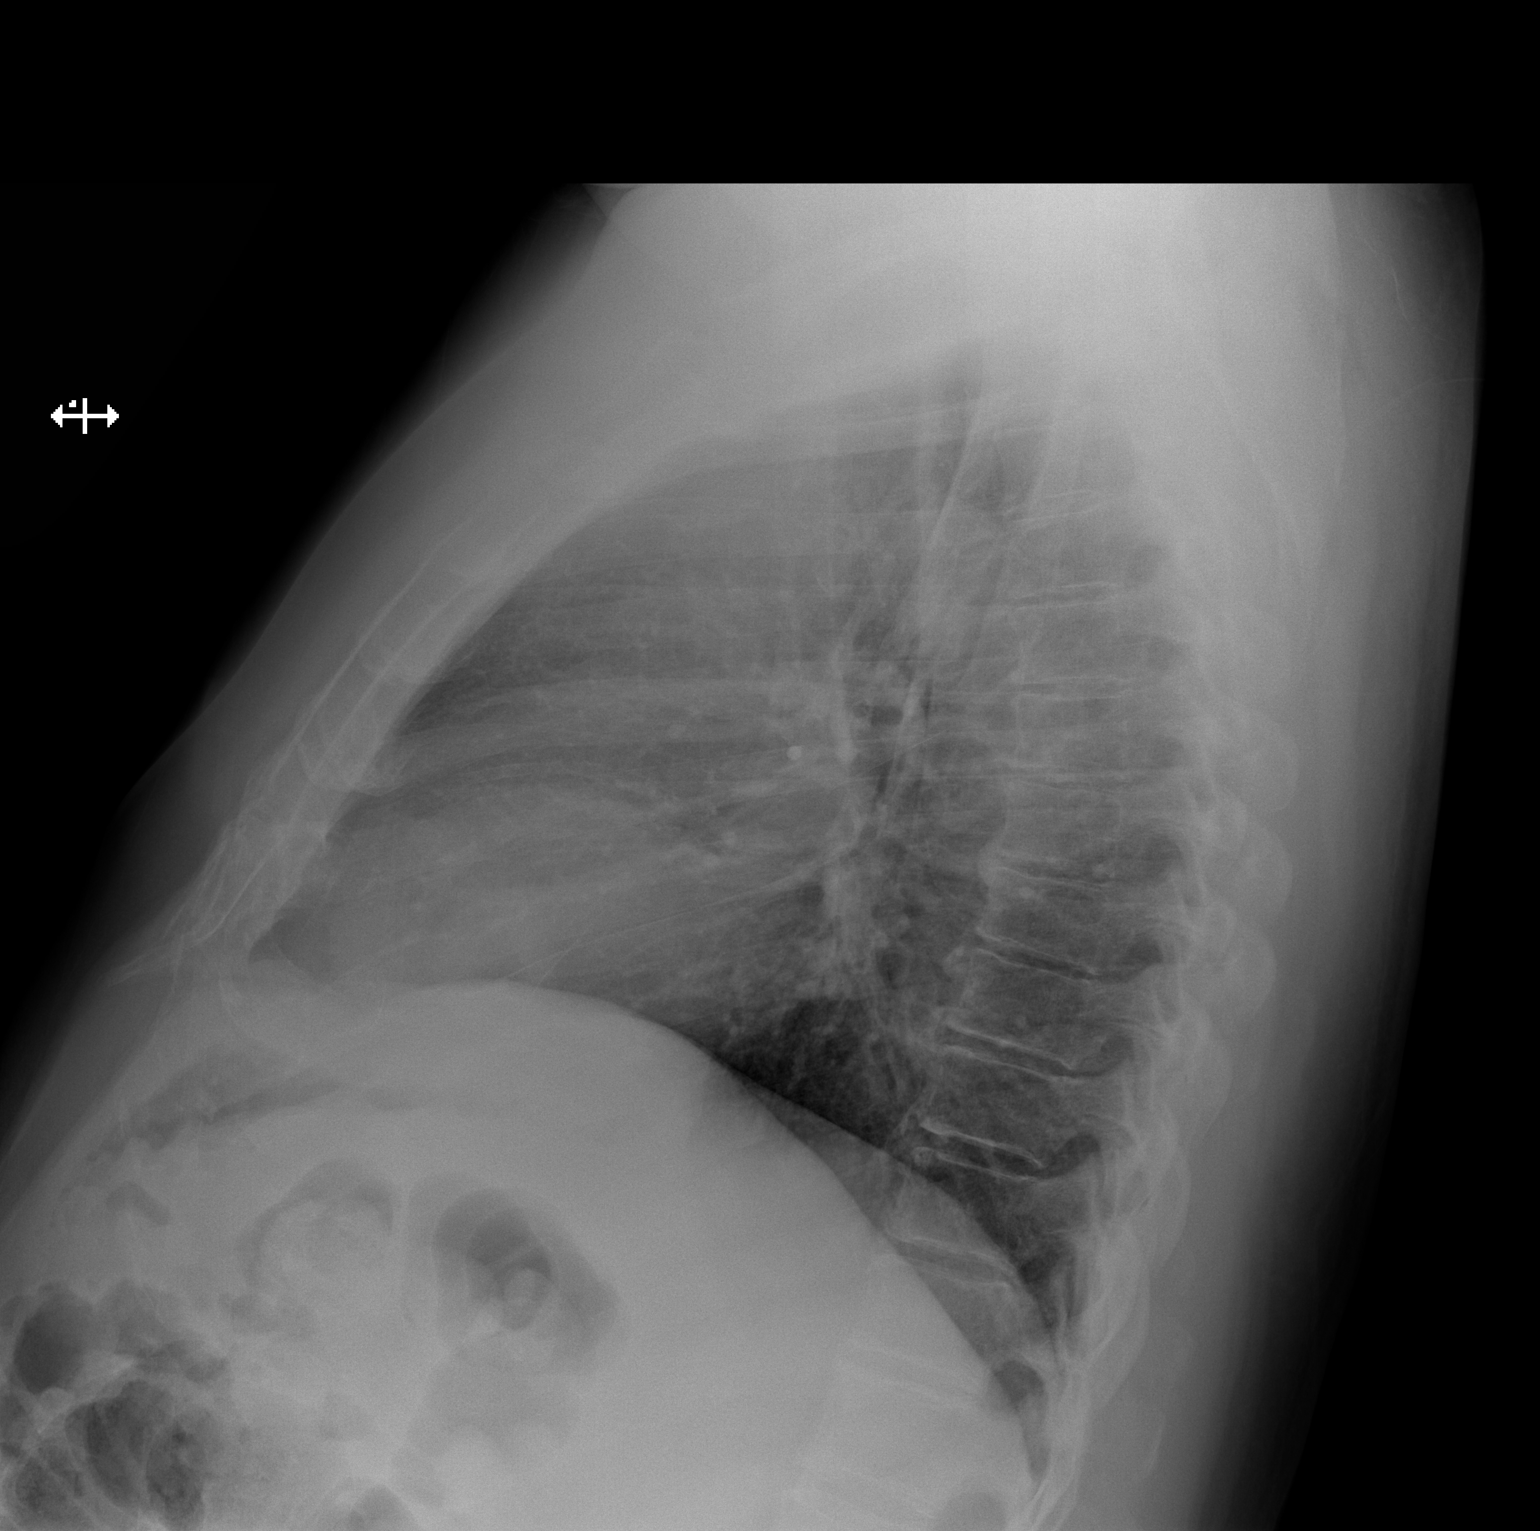

[2 of 2 positions shown; findings below may reference images not displayed]

FINDINGS: The lungs are clear and negative for focal airspace consolidation,
pulmonary edema or suspicious pulmonary nodule. No pleural effusion
or pneumothorax. Cardiac and mediastinal contours are within normal
limits. No acute fracture or lytic or blastic osseous lesions. The
visualized upper abdominal bowel gas pattern is unremarkable.
IMPRESSION: Negative chest x-ray.

## 2021-11-12 IMAGING — RF DG C-ARM 1-60 MIN
1 series · 2 of 2 positions shown · non-contrast
Comparison: MRI LUMBAR SPINE 06/10/2020

CLINICAL DATA: L3-4 AND L4-5 PLIF

EXAM:
OPERATIVE LUMBAR SPINE 2 VIEW(S)

[Series 1: run · 2 of 2 slices shown]
[im 1/2]
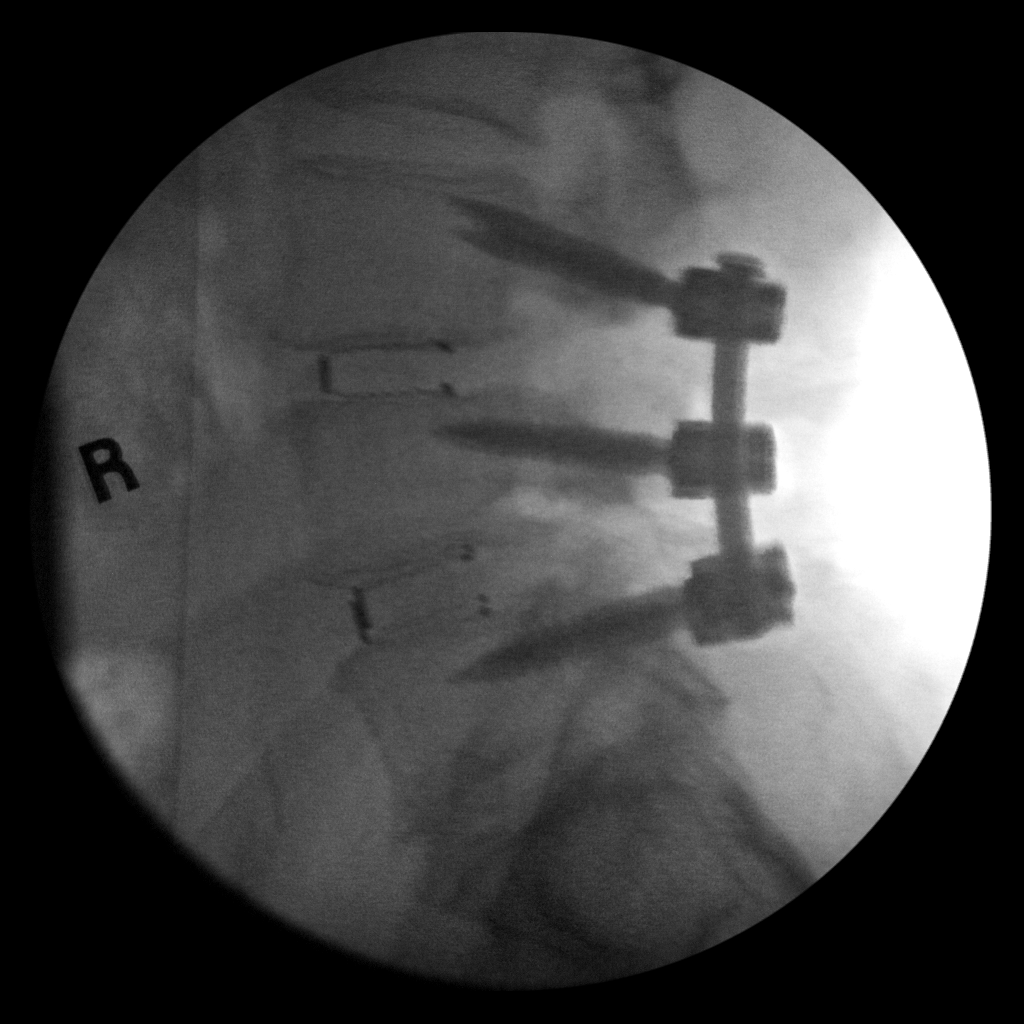
[im 2/2]
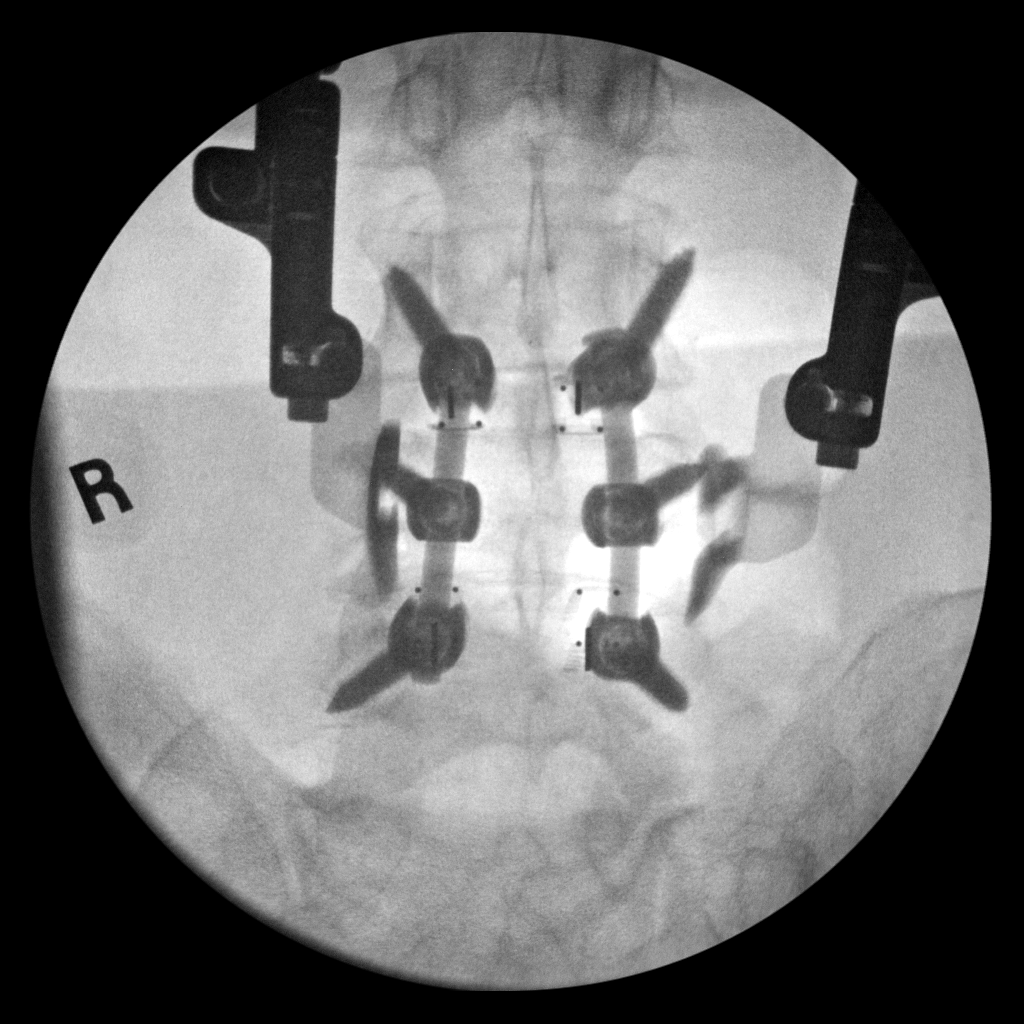

[2 of 2 positions shown; findings below may reference images not displayed]

FINDINGS: Intraoperative images demonstrate pedicle screw and rod fixation at
L3-4 and L4-5. Disc spaces are in place. Alignment is anatomic.
IMPRESSION: L3-4 and L4-5 fusion without radiographic evidence for complication.

## 2024-01-01 ENCOUNTER — Other Ambulatory Visit: Payer: Self-pay | Admitting: Neurological Surgery

## 2024-01-01 DIAGNOSIS — M461 Sacroiliitis, not elsewhere classified: Secondary | ICD-10-CM

## 2024-01-17 ENCOUNTER — Ambulatory Visit
Admission: RE | Admit: 2024-01-17 | Discharge: 2024-01-17 | Disposition: A | Discharge status: Home / Self Care | Payer: Non-veteran care | Source: Ambulatory Visit | Attending: Neurological Surgery | Admitting: Neurological Surgery

## 2024-01-17 DIAGNOSIS — M461 Sacroiliitis, not elsewhere classified: Secondary | ICD-10-CM

## 2024-01-17 MED ORDER — IOPAMIDOL (ISOVUE-M 200) INJECTION 41%
1.0000 mL | Freq: Once | INTRAMUSCULAR | Status: AC | PRN
  Administered 2024-01-17: 1 mL via INTRA_ARTICULAR
